# Patient Record
Sex: Female | Born: 1970 | Hispanic: No | Smoking: Never smoker
Health system: Southern US, Community
[De-identification: ages and names within clinical notes are randomized; demographics above are authoritative.]

## PROBLEM LIST (undated history)

## (undated) HISTORY — PX: KNEE SURGERY: SHX244

---

## 2011-09-04 ENCOUNTER — Ambulatory Visit: Payer: Self-pay | Admitting: Internal Medicine

## 2015-06-07 ENCOUNTER — Other Ambulatory Visit: Payer: Self-pay | Admitting: Internal Medicine

## 2015-06-07 ENCOUNTER — Ambulatory Visit (INDEPENDENT_AMBULATORY_CARE_PROVIDER_SITE_OTHER): Payer: BLUE CROSS/BLUE SHIELD | Admitting: Internal Medicine

## 2015-06-07 ENCOUNTER — Encounter: Payer: Self-pay | Admitting: Internal Medicine

## 2015-06-07 VITALS — BP 108/70 | HR 71 | Temp 98.5°F | Resp 16 | Ht 62.5 in | Wt 121.3 lb

## 2015-06-07 DIAGNOSIS — J3089 Other allergic rhinitis: Secondary | ICD-10-CM | POA: Diagnosis not present

## 2015-06-07 DIAGNOSIS — L501 Idiopathic urticaria: Secondary | ICD-10-CM

## 2015-06-07 MED ORDER — FEXOFENADINE HCL 180 MG PO TABS: 180.0000 mg | ORAL_TABLET | Freq: Every evening | ORAL | Status: AC

## 2015-06-07 MED ORDER — LORATADINE 10 MG PO TABS: 10.0000 mg | ORAL_TABLET | Freq: Every day | ORAL | Status: AC

## 2015-06-07 MED ORDER — FLUTICASONE PROPIONATE 50 MCG/ACT NA SUSP
1.0000 | Freq: Two times a day (BID) | NASAL | Status: AC
Start: 2015-06-07 — End: ?

## 2015-06-07 MED ORDER — AMOXICILLIN-POT CLAVULANATE 875-125 MG PO TABS
ORAL_TABLET | ORAL | Status: DC
Start: 2015-06-07 — End: 2015-08-02

## 2015-06-07 MED ORDER — FLUTICASONE PROPIONATE 50 MCG/ACT NA SUSP: 1.0000 | Freq: Two times a day (BID) | NASAL | Status: DC

## 2015-06-07 MED ORDER — AMOXICILLIN-POT CLAVULANATE 875-125 MG PO TABS: ORAL_TABLET | ORAL | Status: DC

## 2015-06-07 NOTE — Progress Notes (Signed)
Referring provider: Jonathon Resides, MD [place] [address] [place] [address]  History of Present Illness:  Lisa Rodriguez is a 45 y.o. female seen in consultation at the kind request of Dr. Dion Saucier for urticaria.  HPI Comments: Urticaria: For the past month, she has had intermittent hives her neck while she was at work one day.  It has continued on a daily or every other day basis. She occasionally has scattered symptoms over her chest and back but is most concentrated over her neck. She brings in a picture today that is consistent with urticaria. She was seen at the urgent care center and started on Zyrtec, ranitidine and given a steroid injection with improvement in her symptoms.  Despite being on antihistamines, she continued to have some breakthrough urticaria.he denies any triggers such as foods, medications, products.  Rhinitis: Patient has had symptoms since childhood. She feels that she is "always sick". Spring seems to be her worst season. She denies repeated infections requiring antibiotics or unusual infections.   Assessment and Plan: Idiopathic urticaria  Keep a food, medication, activity log for any future reactions  Consider product allergy testing  Use Claritin 10 mg daily in the morning and add Allegra 180 mg daily in the evening  May use Benadryl on an as-needed basis for breakthrough symptoms  Check CU index panel, ANA tryptase, H. Pylori breath test   Other allergic rhinitis  Allergic to mold, dust mite, cockroach. Handouts given  Antihistamines as above  Start Flonase one spray each nostril twice a day  Use nasal saline lavage twice a day prior to nasal sprays  Perform immune screen.  Check CBC, cMP, ESR, quantitative antibody levels, tetanus/pneumococcal/diphtheria titers, CH 50  Start Augmentin 875 mg twice a day for 10 days    Return in about 6 weeks (around 07/19/2015).  Medications ordered this encounter: Meds ordered this encounter   Medications  . Vitamin D, Ergocalciferol, (DRISDOL) 50000 units CAPS capsule    Sig: Take 50,000 Units by mouth every 7 (seven) days.  . Cyanocobalamin (B-12) 500 MCG TABS    Sig: Take 500 mg by mouth every other day.  Marland Kitchen DISCONTD: cetirizine (ZYRTEC) 10 MG tablet    Sig: Take 10 mg by mouth daily.  Marland Kitchen DISCONTD: ranitidine (ZANTAC) 150 MG tablet    Sig: Take 150 mg by mouth 2 (two) times daily.  Marland Kitchen triamcinolone cream (KENALOG) 0.1 %    Sig: Apply 1 application topically 2 (two) times daily.  Marland Kitchen DISCONTD: fluticasone (FLONASE) 50 MCG/ACT nasal spray    Sig: Place 1 spray into both nostrils 2 (two) times daily.    Dispense:  1 g    Refill:  5  . DISCONTD: amoxicillin-clavulanate (AUGMENTIN) 875-125 MG tablet    Sig: TAKE ON TABLET TWICE A DAY FOR 10 DAYS    Dispense:  20 tablet    Refill:  0  . loratadine (CLARITIN) 10 MG tablet    Sig: Take 1 tablet (10 mg total) by mouth daily.    Dispense:  30 tablet    Refill:  5  . fexofenadine (ALLEGRA) 180 MG tablet    Sig: Take 1 tablet (180 mg total) by mouth every evening.    Dispense:  30 tablet    Refill:  5  . amoxicillin-clavulanate (AUGMENTIN) 875-125 MG tablet    Sig: One tablet by mouth twice daily for 10 days for infection    Dispense:  20 tablet    Refill:  0  .  fluticasone (FLONASE) 50 MCG/ACT nasal spray    Sig: Place 1 spray into both nostrils 2 (two) times daily.    Dispense:  16 g    Refill:  5    For stuffy nose or drainage    Diagnostics: Aeroallergen skin testing: Positive for mold, dust mite, cockroach with a good histamine control Food allergy skin testing: Negative with a good histamine control  Skin tests were interpreted by me, transferred into EPIC by CMA, reviewed and accepted by me into EPIC.  Physical Exam: BP 108/70 mmHg  Pulse 71  Temp(Src) 98.5 F (36.9 C) (Oral)  Resp 16  Ht 5' 2.5" (1.588 m)  Wt 121 lb 4.1 oz (55 kg)  BMI 21.81 kg/m2   Physical Exam  Constitutional: She appears  well-developed and well-nourished. No distress.  HENT:  Right Ear: External ear normal.  Left Ear: External ear normal.  Nose: Nose normal.  Mouth/Throat: Oropharynx is clear and moist.  Eyes: Conjunctivae are normal. Right eye exhibits no discharge. Left eye exhibits no discharge.  Cardiovascular: Normal rate, regular rhythm and normal heart sounds.   No murmur heard. Pulmonary/Chest: Effort normal and breath sounds normal. No respiratory distress. She has no wheezes. She has no rales.  Abdominal: Soft. Bowel sounds are normal.  Musculoskeletal: She exhibits no edema.  Lymphadenopathy:    She has no cervical adenopathy.  Neurological: She is alert.  Skin: Rash (1 urticarial lesion on her neck) noted.  Vitals reviewed.   Review of systems: Per HPI unless specifically indicated below Review of Systems  Constitutional: Negative for fever, chills, appetite change and unexpected weight change.  HENT: Negative for congestion, ear pain, postnasal drip, rhinorrhea, sinus pressure, sneezing and sore throat.   Eyes: Positive for discharge (watery) and itching. Negative for pain.  Respiratory: Negative for cough, chest tightness and wheezing.   Cardiovascular: Negative for chest pain and leg swelling.  Gastrointestinal: Negative for vomiting and diarrhea.  Genitourinary: Negative for difficulty urinating.  Musculoskeletal: Negative for joint swelling and arthralgias.  Skin: Positive for rash (per hpi).  Allergic/Immunologic: Negative for environmental allergies, food allergies and immunocompromised state.       Stung by unidentified insect, local swelling No latex allergy  Neurological: Negative for seizures.    Past medical history:  Patient Active Problem List   Diagnosis Date Noted  . Idiopathic urticaria 06/07/2015  . Other allergic rhinitis 06/07/2015    Past surgical history: History reviewed. No pertinent past surgical history.  Family history: Family History  Problem  Relation Age of Onset  . Asthma Brother     Environmental/Social history: She lives in a house that is 47 years of age, she has a non-feather pillow and a feather comforter, there is wood and carpet in the home, there is central air conditioning and heating, there are no pets in the home, she is a nonsmoker, she works as a Education administrator.  Drug Allergies:  Allergies  Allergen Reactions  . Hydrocodone     Medications: Current outpatient prescriptions:  .  amoxicillin-clavulanate (AUGMENTIN) 875-125 MG tablet, One tablet by mouth twice daily for 10 days for infection, Disp: 20 tablet, Rfl: 0 .  Cyanocobalamin (B-12) 500 MCG TABS, Take 500 mg by mouth every other day., Disp: , Rfl:  .  fexofenadine (ALLEGRA) 180 MG tablet, Take 1 tablet (180 mg total) by mouth every evening., Disp: 30 tablet, Rfl: 5 .  fluticasone (FLONASE) 50 MCG/ACT nasal spray, Place 1 spray into both nostrils 2 (two) times  daily., Disp: 16 g, Rfl: 5 .  loratadine (CLARITIN) 10 MG tablet, Take 1 tablet (10 mg total) by mouth daily., Disp: 30 tablet, Rfl: 5 .  triamcinolone cream (KENALOG) 0.1 %, Apply 1 application topically 2 (two) times daily., Disp: , Rfl:  .  Vitamin D, Ergocalciferol, (DRISDOL) 50000 units CAPS capsule, Take 50,000 Units by mouth every 7 (seven) days., Disp: , Rfl:   Thank you for the opportunity to care for this patient.  Please do not hesitate to contact me with questions.

## 2015-06-07 NOTE — Patient Instructions (Addendum)
Idiopathic urticaria  Keep a food, medication, activity log for any future reactions  Consider product allergy testing  Use Claritin 10 mg daily in the morning and add Allegra 180 mg daily in the evening  May use Benadryl on an as-needed basis for breakthrough symptoms  Check CU index panel, ANA tryptase, H. Pylori breath test   Other allergic rhinitis  Allergic to mold, cockroach. Handouts given  Antihistamines as above  Start Flonase one spray each nostril twice a day  Use nasal saline lavage twice a day prior to nasal sprays  Perform immune screen.  Check CBC, cMP, ESR, quantitative antibody levels, tetanus/pneumococcal/diphtheria titers, CH 50  Start Augmentin 875 mg twice a day for 10 days

## 2015-06-07 NOTE — Assessment & Plan Note (Addendum)
   Allergic to mold, dust mite, cockroach. Handouts given  Antihistamines as above  Start Flonase one spray each nostril twice a day  Use nasal saline lavage twice a day prior to nasal sprays  Perform immune screen.  Check CBC, cMP, ESR, quantitative antibody levels, tetanus/pneumococcal/diphtheria titers, CH 50  Start Augmentin 875 mg twice a day for 10 days

## 2015-06-07 NOTE — Assessment & Plan Note (Addendum)
   Keep a food, medication, activity log for any future reactions  Consider product allergy testing  Use Claritin 10 mg daily in the morning and add Allegra 180 mg daily in the evening  May use Benadryl on an as-needed basis for breakthrough symptoms  Check CU index panel, ANA tryptase, H. Pylori breath test

## 2015-06-10 LAB — TISSUE TRANSGLUTAMINASE, IGA: Tissue Transglutaminase Ab, IgA: 1 U/mL (ref <4)

## 2015-06-10 LAB — DIPHTHERIA / TETANUS ANTIBODY PANEL: Tetanus Toxin Antibody, Total: 4.43 IU/mL (ref >0.15)

## 2015-06-10 LAB — COMPLEMENT, TOTAL: Compl, Total (CH50): >60 U/mL — ABNORMAL HIGH (ref 31–60)

## 2015-06-10 LAB — IGG, IGA, IGM
IGA: 240 mg/dL (ref 69–380)
IgG (Immunoglobin G), Serum: 1430 mg/dL (ref 690–1700)
IgM, Serum: 105 mg/dL (ref 52–322)

## 2015-06-11 LAB — STREP PNEUMONIAE 23 SEROTYPES IGG
SEROTYPE 1 (1): 0.7 ug/mL
SEROTYPE 14 (14): 2.9 ug/mL
SEROTYPE 19 (19F): 1.5 ug/mL
SEROTYPE 20 (20): 3.6 ug/mL
SEROTYPE 54 (15B): 6 ug/mL
SEROTYPE 68 (9V): 1.2 ug/mL
Serotype 17 (17F): 1.9 ug/mL
Serotype 22 (22F): <0.3 ug/mL
Serotype 23 (23F): <0.3 ug/mL
Serotype 26 (6B): <0.3 ug/mL
Serotype 3 (3): <0.3 ug/mL
Serotype 34 (10A): <0.3 ug/mL
Serotype 4 (4): <0.3 ug/mL
Serotype 43 (11A): <0.3 ug/mL
Serotype 5 (5): 1.6 ug/mL
Serotype 51 (7F): 0.6 ug/mL
Serotype 56 (18C): 0.8 ug/mL
Serotype 70 (33F): 1.9 ug/mL
Serotype 8 (8): <0.3 ug/mL
Serotype 9 (9N): <0.3 ug/mL

## 2015-06-13 ENCOUNTER — Encounter: Payer: Self-pay | Admitting: *Deleted

## 2015-06-18 LAB — CP CHRONIC URTICARIA INDEX PANEL
THYROID PEROXIDASE ANTIBODY: 5 [IU]/mL (ref <9)
TSH: 2.84 mIU/L

## 2015-06-24 ENCOUNTER — Other Ambulatory Visit: Payer: Self-pay | Admitting: Internal Medicine

## 2015-06-24 DIAGNOSIS — L501 Idiopathic urticaria: Secondary | ICD-10-CM

## 2015-06-25 LAB — ANTI-NUCLEAR AB-TITER (ANA TITER): ANA Titer 1: >=1:1280 {titer} — ABNORMAL HIGH

## 2015-06-25 LAB — CBC WITH DIFFERENTIAL/PLATELET
BASOS ABS: 0 10*3/uL (ref 0.0–0.1)
BASOS PCT: 0 % (ref 0–1)
EOS ABS: 0.1 10*3/uL (ref 0.0–0.7)
EOS PCT: 2 % (ref 0–5)
HCT: 38 % (ref 36.0–46.0)
Hemoglobin: 12 g/dL (ref 12.0–15.0)
Lymphocytes Relative: 31 % (ref 12–46)
Lymphs Abs: 1.6 10*3/uL (ref 0.7–4.0)
MCH: 26.9 pg (ref 26.0–34.0)
MCHC: 31.6 g/dL (ref 30.0–36.0)
MCV: 85.2 fL (ref 78.0–100.0)
MPV: 11.7 fL (ref 8.6–12.4)
Monocytes Absolute: 0.5 10*3/uL (ref 0.1–1.0)
Monocytes Relative: 10 % (ref 3–12)
NEUTROS PCT: 57 % (ref 43–77)
Neutro Abs: 2.9 10*3/uL (ref 1.7–7.7)
Platelets: 311 10*3/uL (ref 150–400)
RBC: 4.46 MIL/uL (ref 3.87–5.11)
RDW: 16 % — AB (ref 11.5–15.5)
WBC: 5.1 10*3/uL (ref 4.0–10.5)

## 2015-06-25 LAB — ANA: ANA: POSITIVE — AB

## 2015-06-25 LAB — COMPLETE METABOLIC PANEL WITH GFR
ALK PHOS: 24 U/L — AB (ref 33–115)
ALT: 18 U/L (ref 6–29)
AST: 20 U/L (ref 10–35)
Albumin: 4 g/dL (ref 3.6–5.1)
BILIRUBIN TOTAL: 0.7 mg/dL (ref 0.2–1.2)
BUN: 9 mg/dL (ref 7–25)
CALCIUM: 9.3 mg/dL (ref 8.6–10.2)
CO2: 26 mmol/L (ref 20–31)
CREATININE: 0.69 mg/dL (ref 0.50–1.10)
Chloride: 105 mmol/L (ref 98–110)
Glucose, Bld: 83 mg/dL (ref 65–99)
Potassium: 4.2 mmol/L (ref 3.5–5.3)
Sodium: 138 mmol/L (ref 135–146)
TOTAL PROTEIN: 7.1 g/dL (ref 6.1–8.1)

## 2015-06-25 LAB — H. PYLORI BREATH TEST

## 2015-06-25 LAB — TRYPTASE: Tryptase: 3.7 ug/L (ref <11)

## 2015-06-25 LAB — SEDIMENTATION RATE: Sed Rate: 6 mm/hr (ref 0–20)

## 2015-07-01 NOTE — Addendum Note (Signed)
Addended by: Clifton JamesLARK, Mackie Goon L on: 07/01/2015 12:19 PM   Modules accepted: Orders

## 2015-07-04 ENCOUNTER — Telehealth: Payer: Self-pay | Admitting: Internal Medicine

## 2015-07-04 NOTE — Telephone Encounter (Signed)
She received a bill for $1470. She says that she spoke with her insurance and they told her that the charges wouldn't be covered. She wants to know if she can get a discount since they won't pay. If she can get a discount she would like to set up payments for the remainder.

## 2015-07-04 NOTE — Telephone Encounter (Signed)
PT WANTS ME TO ASK DR BHATTI FOR A DISCOUNT - TOLD HER I WOULD CALL HER BACK NEXT WEEK

## 2015-07-08 ENCOUNTER — Telehealth: Payer: Self-pay

## 2015-07-08 ENCOUNTER — Encounter (HOSPITAL_BASED_OUTPATIENT_CLINIC_OR_DEPARTMENT_OTHER): Payer: Self-pay | Admitting: *Deleted

## 2015-07-08 ENCOUNTER — Emergency Department (HOSPITAL_BASED_OUTPATIENT_CLINIC_OR_DEPARTMENT_OTHER)
Admission: EM | Admit: 2015-07-08 | Discharge: 2015-07-08 | Disposition: A | Discharge status: Home / Self Care | Payer: BLUE CROSS/BLUE SHIELD | Attending: Emergency Medicine | Admitting: Emergency Medicine

## 2015-07-08 DIAGNOSIS — R21 Rash and other nonspecific skin eruption: Secondary | ICD-10-CM | POA: Diagnosis present

## 2015-07-08 DIAGNOSIS — L299 Pruritus, unspecified: Secondary | ICD-10-CM | POA: Insufficient documentation

## 2015-07-08 DIAGNOSIS — Z79899 Other long term (current) drug therapy: Secondary | ICD-10-CM | POA: Diagnosis not present

## 2015-07-08 DIAGNOSIS — Z7951 Long term (current) use of inhaled steroids: Secondary | ICD-10-CM | POA: Insufficient documentation

## 2015-07-08 DIAGNOSIS — L539 Erythematous condition, unspecified: Secondary | ICD-10-CM | POA: Insufficient documentation

## 2015-07-08 DIAGNOSIS — Z7952 Long term (current) use of systemic steroids: Secondary | ICD-10-CM | POA: Diagnosis not present

## 2015-07-08 MED ORDER — PREDNISONE 20 MG PO TABS: 60.0000 mg | ORAL_TABLET | Freq: Every day | ORAL | Status: DC

## 2015-07-08 NOTE — ED Provider Notes (Signed)
CSN: [id number]     Arrival date & time 07/08/15  1036 History   First MD Initiated Contact with Patient 07/08/15 1237     Chief Complaint  Patient presents with  . Rash     (Consider location/radiation/quality/duration/timing/severity/associated sxs/prior Treatment) HPI Comments: Patient presents today with a rash located on her back and also the left anterior chest.  She states that the rash of her chest has been present for the past 1.5 months.  She reports that she was seen by her PCP for this rash and was then referred to an Allergist.  She states that the Allergist ordered several tests and could not find an actual allergy.  She also reports that a ANA was ordered and was found to be elevated.  She was then referred to a Rheumatologist, but has not yet followed up.  She reports that the rash on her back has been present for the past 4 days.  Rash is pruritic.  She denies new soaps, detergents, lotions, or medications.  She does report that she had a Pneumonia vaccine 5 days ago and thinks that she may be having a reaction to that.  She denies any swelling of the lips, tongue, or throat.  Denies SOB or wheezing.  Denies numbness or tingling.  She states that she is taking Zyrtec, but does not feel that it is helping the rash.    The history is provided by the patient.    History reviewed. No pertinent past medical history. Past Surgical History  Procedure Laterality Date  . Knee surgery     Family History  Problem Relation Age of Onset  . Asthma Brother    Social History  Substance Use Topics  . Smoking status: Never Smoker   . Smokeless tobacco: None  . Alcohol Use: No   OB History    No data available     Review of Systems  All other systems reviewed and are negative.     Allergies  Hydrocodone  Home Medications   Prior to Admission medications   Medication Sig Start Date End Date Taking? Authorizing Provider  cetirizine (ZYRTEC) 10 MG tablet Take 10 mg by mouth  daily.   Yes Historical Provider, MD  Cyanocobalamin (B-12) 500 MCG TABS Take 500 mg by mouth every other day.   Yes Historical Provider, MD  triamcinolone cream (KENALOG) 0.1 % Apply 1 application topically 2 (two) times daily.   Yes Historical Provider, MD  Vitamin D, Ergocalciferol, (DRISDOL) 50000 units CAPS capsule Take 50,000 Units by mouth every 7 (seven) days.   Yes Historical Provider, MD  amoxicillin-clavulanate (AUGMENTIN) 875-125 MG tablet One tablet by mouth twice daily for 10 days for infection 06/07/15   Mikki SanteeSokun Bhatti, MD  fexofenadine (ALLEGRA) 180 MG tablet Take 1 tablet (180 mg total) by mouth every evening. 06/07/15   Mikki SanteeSokun Bhatti, MD  fluticasone (FLONASE) 50 MCG/ACT nasal spray Place 1 spray into both nostrils 2 (two) times daily. 06/07/15   Mikki SanteeSokun Bhatti, MD  loratadine (CLARITIN) 10 MG tablet Take 1 tablet (10 mg total) by mouth daily. 06/07/15   Mikki SanteeSokun Bhatti, MD   BP 138/94 mmHg  Pulse 77  Temp(Src) 97.8 F (36.6 C) (Oral)  Resp 18  Ht 5\' 4"  (1.626 m)  Wt 54.885 kg  BMI 20.76 kg/m2  SpO2 100%  LMP 06/24/2015 Physical Exam  Constitutional: She appears well-developed and well-nourished.  HENT:  Head: Normocephalic and atraumatic.  Mouth/Throat: Oropharynx is clear and moist.  Neck: Normal range  of motion. Neck supple.  Cardiovascular: Normal rate, regular rhythm and normal heart sounds.   Pulmonary/Chest: Effort normal and breath sounds normal. No respiratory distress. She has no wheezes. She has no rales.  Musculoskeletal: Normal range of motion.  Neurological: She is alert.  Skin: Skin is warm and dry.     Erythematous papular rash located left anterior chest and down the center of the back.  No drainage.  Psychiatric: She has a normal mood and affect.  Nursing note and vitals reviewed.   ED Course  Procedures (including critical care time) Labs Review Labs Reviewed - No data to display  Imaging Review No results found. I have personally reviewed and  evaluated these images and lab results as part of my medical decision-making.   EKG Interpretation None      MDM   Final diagnoses:  None   Patient presents today with a rash of her chest that has been there 1.5 months and also a rash of her back that has been present for 4 days.  She reports that she recently saw an Allergist and had an ANA blood test, which came back elevated.  She was referred to Rheumatology, but has not yet followed up.  No signs of Anaphylaxis.  Feel that the patient is stable for discharge.  Return precautions given.   Santiago Glad, PA-C 07/09/15 4098  Geoffery Lyons, MD 07/11/15 [phone]

## 2015-07-08 NOTE — ED Notes (Signed)
Rash on trunk since end of February. Denies fever. Using zyrtec

## 2015-07-08 NOTE — Telephone Encounter (Signed)
Patient called this morning anxious about getting referral to [facility].  Vella RedheadHeather Clark states she  made referral last week.  States we are waiting for [facility] [phone] to call us and patient with appointment status.  I called them to get an update on status of referral.  Spoke with Inetta Fermoina, then Lawson FiscalLori at [facility].  I was told referral was never received.  Lawson FiscalLori suggested I fax all demographics, notes and labs/imaging results to [phone].  Faxed all info to LadueLori this morning. Patient stating she now has a bad rash on back around spine and on her chest area.  She went to and emergency clinic over the weekend and was told this could be an allergic reaction.  Zyrtec and Allegra not helping.  Patient wants to speak with Dr. Clydie BraunBhatti about rash and could this be coming from Pneumovax pt received recently.

## 2015-07-08 NOTE — Telephone Encounter (Signed)
[facility] faxed referral authorization at 3:25pm today.  Pt has appointment with them on 07/16/15 at 9:00am.  Patient is aware of appointment.

## 2015-07-12 NOTE — Telephone Encounter (Signed)
noted 

## 2015-07-17 NOTE — Telephone Encounter (Signed)
Dr Clydie BraunBhatti gave her a 10% discount - called pt  - she will pay $50/mo

## 2015-07-19 ENCOUNTER — Ambulatory Visit: Payer: BLUE CROSS/BLUE SHIELD | Admitting: Internal Medicine

## 2015-08-02 ENCOUNTER — Ambulatory Visit: Payer: BLUE CROSS/BLUE SHIELD | Admitting: Internal Medicine

## 2015-08-02 ENCOUNTER — Ambulatory Visit (INDEPENDENT_AMBULATORY_CARE_PROVIDER_SITE_OTHER): Payer: BLUE CROSS/BLUE SHIELD | Admitting: Internal Medicine

## 2015-08-02 ENCOUNTER — Encounter: Payer: Self-pay | Admitting: Internal Medicine

## 2015-08-02 VITALS — BP 110/70 | HR 62 | Temp 98.3°F | Resp 14

## 2015-08-02 DIAGNOSIS — J3089 Other allergic rhinitis: Secondary | ICD-10-CM | POA: Diagnosis not present

## 2015-08-02 DIAGNOSIS — L501 Idiopathic urticaria: Secondary | ICD-10-CM

## 2015-08-02 NOTE — Assessment & Plan Note (Signed)
   Currently well controlled  Continue Zyrtec 10 mg daily as needed  May add Claritin or Allegra for breakthrough symptoms

## 2015-08-02 NOTE — Assessment & Plan Note (Signed)
   Check postvaccine titers in 6 weeks to pneumococcus  Consider allergy injections. She will let us know if she wishes to proceed  Continue Zyrtec 10 mg daily along with as needed fluticasone

## 2015-08-02 NOTE — Patient Instructions (Signed)
Other allergic rhinitis  Check postvaccine titers in 6 weeks to pneumococcus  Consider allergy injections. She will let us know if she wishes to proceed  Continue Zyrtec 10 mg daily along with as needed fluticasone  Idiopathic urticaria  Currently well controlled  Continue Zyrtec 10 mg daily as needed  May add Claritin or Allegra for breakthrough symptoms

## 2015-08-02 NOTE — Progress Notes (Signed)
History of Present Illness: Lisa Rodriguez is a 45 y.o. female presenting for follow-up  HPI Comments: Urticaria: Symptoms have been well controlled with Zyrtec. She has not had any rash for a few days. Lab work ordered at last visit revealed a normal CBC with differential, CMP, ESR, tryptase, H. pylori, CU index panel  Allergic rhinitis: Skin testing at last visit was positive for mold, dust mite, cockroach. She is having good symptom control with Zyrtec and as needed fluticasone. She has not had any interval infections. An immune screen done because of her frequent infections demonstrated normal quantitative antibody levels, CH 50, tetanus and diphtheria titers. Pneumococcal titers were not protective so she received a Pneumovax and is due to get postvaccine titers in 6 weeks.   Current Outpatient Prescriptions on File Prior to Visit  Medication Sig Dispense Refill  . cetirizine (ZYRTEC) 10 MG tablet Take 10 mg by mouth daily.    . Cyanocobalamin (B-12) 500 MCG TABS Take 500 mg by mouth every other day.    . fluticasone (FLONASE) 50 MCG/ACT nasal spray Place 1 spray into both nostrils 2 (two) times daily. 16 g 5  . triamcinolone cream (KENALOG) 0.1 % Apply 1 application topically as needed.     . Vitamin D, Ergocalciferol, (DRISDOL) 50000 units CAPS capsule Take 50,000 Units by mouth every 7 (seven) days.    . fexofenadine (ALLEGRA) 180 MG tablet Take 1 tablet (180 mg total) by mouth every evening. (Patient not taking: Reported on 08/02/2015) 30 tablet 5  . loratadine (CLARITIN) 10 MG tablet Take 1 tablet (10 mg total) by mouth daily. (Patient not taking: Reported on 08/02/2015) 30 tablet 5   No current facility-administered medications on file prior to visit.    Assessment and Plan: Other allergic rhinitis  Check postvaccine titers in 6 weeks to pneumococcus  Consider allergy injections. She will let us know if she wishes to proceed  Continue Zyrtec 10 mg daily along with as needed  fluticasone  Idiopathic urticaria  Currently well controlled  Continue Zyrtec 10 mg daily as needed  May add Claritin or Allegra for breakthrough symptoms    Return in about 1 year (around 08/01/2016).  No orders of the defined types were placed in this encounter.   Physical Exam: BP 110/70 mmHg  Pulse 62  Temp(Src) 98.3 F (36.8 C) (Oral)  Resp 14  LMP 06/24/2015   Physical Exam  Constitutional: She appears well-developed and well-nourished. No distress.  HENT:  Right Ear: External ear normal.  Left Ear: External ear normal.  Nose: Nose normal.  Mouth/Throat: Oropharynx is clear and moist.  Eyes: Conjunctivae are normal. Right eye exhibits no discharge. Left eye exhibits no discharge.  Cardiovascular: Normal rate, regular rhythm and normal heart sounds.   No murmur heard. Pulmonary/Chest: Effort normal and breath sounds normal. No respiratory distress. She has no wheezes. She has no rales.  Abdominal: Soft. Bowel sounds are normal.  Musculoskeletal: She exhibits no edema.  Lymphadenopathy:    She has no cervical adenopathy.  Neurological: She is alert.  Skin: No rash noted.  Vitals reviewed.   Drug Allergies:  Allergies  Allergen Reactions  . Hydrocodone     ROS: Per HPI unless specifically indicated below Review of Systems  Thank you for the opportunity to care for this patient.  Please do not hesitate to contact me with questions.

## 2015-08-15 ENCOUNTER — Telehealth: Payer: Self-pay

## 2015-08-15 NOTE — Telephone Encounter (Signed)
PATIENT CALLED. WANTS TO SPEAK WITH DR BHATTI'S NURSE.  WOULD NOT STATE WHY WITH LINDA COLLINS. LINDA CALLED PATIENT BACK, NO ANSWER. LEFT MESSAGE.

## 2015-08-19 NOTE — Telephone Encounter (Signed)
LEFT MESSAGE FOR PT TO CALL BACK.

## 2015-08-22 NOTE — Telephone Encounter (Signed)
TRIED TO CALL PATIENT X 3.  LEFT MESSAGE. OK PER DR BHATTI TO CLOSE ENCOUNTER OUT.

## 2016-10-13 DIAGNOSIS — E559 Vitamin D deficiency, unspecified: Secondary | ICD-10-CM | POA: Insufficient documentation

## 2016-10-13 NOTE — Progress Notes (Deleted)
[facility] [place] [address] [place], [place] [address] [phone] [phone]  Date:  10/15/2016   Name:  Lenna GilfordBinta Speelman   DOB:  [birth year]   MRN:  [id number]  PCP:  Gillian ScarceZanard, Robyn K, MD    Chief Complaint: No chief complaint on file.   History of Present Illness:  Lenna GilfordBinta Kelnhofer is a 46 y.o. very pleasant female patient who presents with the following:  Here today as a new patient to this office.  [facility]. A/P from that visit mentions history of vitamin D def, positive ANA with negative follow-up testing and borderline low hg.   Here today with concern of      Patient Active Problem List   Diagnosis Date Noted  . Idiopathic urticaria 06/07/2015  . Other allergic rhinitis 06/07/2015    No past medical history on file.  Past Surgical History:  Procedure Laterality Date  . KNEE SURGERY      Social History  Substance Use Topics  . Smoking status: Never Smoker  . Smokeless tobacco: Not on file  . Alcohol use No    Family History  Problem Relation Age of Onset  . Asthma Brother     Allergies  Allergen Reactions  . Hydrocodone     Medication list has been reviewed and updated.  Current Outpatient Prescriptions on File Prior to Visit  Medication Sig Dispense Refill  . cetirizine (ZYRTEC) 10 MG tablet Take 10 mg by mouth daily.    . Cyanocobalamin (B-12) 500 MCG TABS Take 500 mg by mouth every other day.    . fexofenadine (ALLEGRA) 180 MG tablet Take 1 tablet (180 mg total) by mouth every evening. (Patient not taking: Reported on 08/02/2015) 30 tablet 5  . fluticasone (FLONASE) 50 MCG/ACT nasal spray Place 1 spray into both nostrils 2 (two) times daily. 16 g 5  . loratadine (CLARITIN) 10 MG tablet Take 1 tablet (10 mg total) by mouth daily. (Patient not taking: Reported on 08/02/2015) 30 tablet 5  . triamcinolone cream (KENALOG) 0.1 % Apply 1 application topically as needed.     .  Vitamin D, Ergocalciferol, (DRISDOL) 50000 units CAPS capsule Take 50,000 Units by mouth every 7 (seven) days.     No current facility-administered medications on file prior to visit.     Review of Systems:  As per HPI- otherwise negative.   Physical Examination: There were no vitals filed for this visit. There were no vitals filed for this visit. There is no height or weight on file to calculate BMI. Ideal Body Weight:    GEN: WDWN, NAD, Non-toxic, A & O x 3 HEENT: Atraumatic, Normocephalic. Neck supple. No masses, No LAD. Ears and Nose: No external deformity. CV: RRR, No M/G/R. No JVD. No thrill. No extra heart sounds. PULM: CTA B, no wheezes, crackles, rhonchi. No retractions. No resp. distress. No accessory muscle use. ABD: S, NT, ND, +BS. No rebound. No HSM. EXTR: No c/c/e NEURO Normal gait.  PSYCH: Normally interactive. Conversant. Not depressed or anxious appearing.  Calm demeanor.    Assessment and Plan: ***  Signed Abbe AmsterdamJessica Kanita Delage, MD

## 2016-10-15 ENCOUNTER — Encounter: Payer: Self-pay | Admitting: Family Medicine

## 2016-10-15 ENCOUNTER — Ambulatory Visit: Payer: BLUE CROSS/BLUE SHIELD | Admitting: Family Medicine

## 2017-02-11 ENCOUNTER — Telehealth: Payer: Self-pay | Admitting: Family Medicine

## 2017-02-11 NOTE — Telephone Encounter (Signed)
Pt says that she called to cancel her np apt that was scheduled on 10/15/16. Im not showing were pt cancelled. Pt would like to know if provider could waive fee.   Please advise.

## 2017-02-11 NOTE — Telephone Encounter (Signed)
Great!.  [place] could you waive no show fee.    Thanks

## 2017-02-11 NOTE — Telephone Encounter (Signed)
That is fine, can waive fee

## 2018-05-03 ENCOUNTER — Emergency Department (HOSPITAL_COMMUNITY): Payer: Commercial Managed Care - PPO

## 2018-05-03 ENCOUNTER — Emergency Department (HOSPITAL_COMMUNITY)
Admission: EM | Admit: 2018-05-03 | Discharge: 2018-05-03 | Disposition: A | Discharge status: Home / Self Care | Payer: Commercial Managed Care - PPO | Attending: Emergency Medicine | Admitting: Emergency Medicine

## 2018-05-03 ENCOUNTER — Encounter (HOSPITAL_COMMUNITY): Payer: Self-pay | Admitting: *Deleted

## 2018-05-03 ENCOUNTER — Other Ambulatory Visit: Payer: Self-pay

## 2018-05-03 DIAGNOSIS — R1013 Epigastric pain: Secondary | ICD-10-CM | POA: Diagnosis not present

## 2018-05-03 DIAGNOSIS — R0789 Other chest pain: Secondary | ICD-10-CM | POA: Diagnosis not present

## 2018-05-03 DIAGNOSIS — Z79899 Other long term (current) drug therapy: Secondary | ICD-10-CM | POA: Insufficient documentation

## 2018-05-03 DIAGNOSIS — R0602 Shortness of breath: Secondary | ICD-10-CM | POA: Diagnosis not present

## 2018-05-03 DIAGNOSIS — R112 Nausea with vomiting, unspecified: Secondary | ICD-10-CM | POA: Diagnosis not present

## 2018-05-03 LAB — I-STAT BETA HCG BLOOD, ED (MC, WL, AP ONLY): I-stat hCG, quantitative: <5 m[IU]/mL (ref <5)

## 2018-05-03 LAB — CBC WITH DIFFERENTIAL/PLATELET
Abs Immature Granulocytes: 0.03 10*3/uL (ref 0.00–0.07)
BASOS PCT: 1 %
Basophils Absolute: 0.1 10*3/uL (ref 0.0–0.1)
Eosinophils Absolute: 0.4 10*3/uL (ref 0.0–0.5)
Eosinophils Relative: 4 %
HCT: 36.8 % (ref 36.0–46.0)
Hemoglobin: 11.5 g/dL — ABNORMAL LOW (ref 12.0–15.0)
Immature Granulocytes: 0 %
Lymphocytes Relative: 25 %
Lymphs Abs: 2.6 10*3/uL (ref 0.7–4.0)
MCH: 26.2 pg (ref 26.0–34.0)
MCHC: 31.3 g/dL (ref 30.0–36.0)
MCV: 83.8 fL (ref 80.0–100.0)
MONO ABS: 0.8 10*3/uL (ref 0.1–1.0)
Monocytes Relative: 7 %
Neutro Abs: 6.6 10*3/uL (ref 1.7–7.7)
Neutrophils Relative %: 63 %
Platelets: 263 10*3/uL (ref 150–400)
RBC: 4.39 MIL/uL (ref 3.87–5.11)
RDW: 14.3 % (ref 11.5–15.5)
WBC: 10.5 10*3/uL (ref 4.0–10.5)
nRBC: 0 % (ref 0.0–0.2)

## 2018-05-03 LAB — LIPASE, BLOOD: Lipase: 47 U/L (ref 11–51)

## 2018-05-03 LAB — I-STAT TROPONIN, ED
Troponin i, poc: 0 ng/mL (ref 0.00–0.08)
Troponin i, poc: 0 ng/mL (ref 0.00–0.08)

## 2018-05-03 LAB — COMPREHENSIVE METABOLIC PANEL
ALT: 31 U/L (ref 0–44)
AST: 29 U/L (ref 15–41)
Albumin: 3.8 g/dL (ref 3.5–5.0)
Alkaline Phosphatase: 29 U/L — ABNORMAL LOW (ref 38–126)
Anion gap: 8 (ref 5–15)
BUN: 11 mg/dL (ref 6–20)
CO2: 21 mmol/L — ABNORMAL LOW (ref 22–32)
Calcium: 9.2 mg/dL (ref 8.9–10.3)
Chloride: 109 mmol/L (ref 98–111)
Creatinine, Ser: 0.76 mg/dL (ref 0.44–1.00)
Glucose, Bld: 110 mg/dL — ABNORMAL HIGH (ref 70–99)
Potassium: 3.8 mmol/L (ref 3.5–5.1)
Sodium: 138 mmol/L (ref 135–145)
TOTAL PROTEIN: 7.1 g/dL (ref 6.5–8.1)
Total Bilirubin: 0.7 mg/dL (ref 0.3–1.2)

## 2018-05-03 LAB — URINALYSIS, ROUTINE W REFLEX MICROSCOPIC
Bilirubin Urine: NEGATIVE
Glucose, UA: NEGATIVE mg/dL
Hgb urine dipstick: NEGATIVE
Ketones, ur: NEGATIVE mg/dL
Nitrite: NEGATIVE
PH: 8 (ref 5.0–8.0)
Protein, ur: NEGATIVE mg/dL
Specific Gravity, Urine: 1.004 — ABNORMAL LOW (ref 1.005–1.030)

## 2018-05-03 MED ORDER — ALUM & MAG HYDROXIDE-SIMETH 200-200-20 MG/5ML PO SUSP
30.0000 mL | Freq: Once | ORAL | Status: AC
  Administered 2018-05-03: 30 mL via ORAL
  Filled 2018-05-03: qty 30

## 2018-05-03 MED ORDER — DICYCLOMINE HCL 10 MG/5ML PO SOLN
10.0000 mg | Freq: Once | ORAL | Status: AC
  Administered 2018-05-03: 10 mg via ORAL
  Filled 2018-05-03: qty 5

## 2018-05-03 MED ORDER — IOPAMIDOL (ISOVUE-370) INJECTION 76%
100.0000 mL | Freq: Once | INTRAVENOUS | Status: AC | PRN
  Administered 2018-05-03: 100 mL via INTRAVENOUS

## 2018-05-03 MED ORDER — ONDANSETRON HCL 4 MG/2ML IJ SOLN
4.0000 mg | Freq: Once | INTRAMUSCULAR | Status: AC
  Administered 2018-05-03: 4 mg via INTRAVENOUS
  Filled 2018-05-03: qty 2

## 2018-05-03 MED ORDER — IOPAMIDOL (ISOVUE-370) INJECTION 76%
INTRAVENOUS | Status: AC
  Filled 2018-05-03: qty 100

## 2018-05-03 MED ORDER — FAMOTIDINE 20 MG PO TABS
20.0000 mg | ORAL_TABLET | Freq: Once | ORAL | Status: AC
  Administered 2018-05-03: 20 mg via ORAL
  Filled 2018-05-03: qty 1

## 2018-05-03 MED ORDER — FENTANYL CITRATE (PF) 100 MCG/2ML IJ SOLN
50.0000 ug | Freq: Once | INTRAMUSCULAR | Status: AC
  Administered 2018-05-03: 50 ug via INTRAVENOUS
  Filled 2018-05-03: qty 2

## 2018-05-03 NOTE — ED Notes (Signed)
Patient verbalizes understanding of discharge instructions. Opportunity for questioning and answers were provided. Armband removed by staff, pt discharged from ED ambulatory to home.  

## 2018-05-03 NOTE — ED Notes (Signed)
Delay in EKG crossing over. Pt not in room. Will repeat when pts returns

## 2018-05-03 NOTE — ED Provider Notes (Signed)
[facility]   CSN: [id number] Arrival date & time: 05/03/18  1609     History   Chief Complaint Chief Complaint  Patient presents with  . Abdominal Pain  . Emesis    HPI Lisa Rodriguez is a 48 y.o. female.  48yo F w/ PMH below who p/w abdominal pain. This afternoon at work, she suddenly began having sharp, severe upper abdominal pain that has radiated up into her chest and caused her to feel SOB and nauseated. Pain has been waxing and waning in intensity. She had 4 ASA and NTG at work then EMS gave her another NTG. Pain in abdomen is currently 4/10. She has never had this before. She denies any fevers, diarrhea, urinary symptoms, or recent illness. No recent travel, h/o clots, h/o cancer, estrogen use, or FH of heart disease. No alcohol, tobacco, or drug use.  The history is provided by the patient.  Abdominal Pain  Associated symptoms: vomiting   Emesis  Associated symptoms: abdominal pain     History reviewed. No pertinent past medical history.  Patient Active Problem List   Diagnosis Date Noted  . Vitamin D deficiency 10/13/2016  . Vitamin D deficiency 10/13/2016  . Idiopathic urticaria 06/07/2015  . Other allergic rhinitis 06/07/2015    Past Surgical History:  Procedure Laterality Date  . KNEE SURGERY       OB History   No obstetric history on file.      Home Medications    Prior to Admission medications   Medication Sig Start Date End Date Taking? Authorizing Provider  cetirizine (ZYRTEC) 10 MG tablet Take 10 mg by mouth daily.    [provider]  Cyanocobalamin (B-12) 500 MCG TABS Take 500 mg by mouth every other day.    [provider]  fexofenadine (ALLEGRA) 180 MG tablet Take 1 tablet (180 mg total) by mouth every evening. Patient not taking: Reported on 08/02/2015 06/07/15   Mikki SanteeBhatti, Sokun, MD  fluticasone [facility](FLONASE) 50 MCG/ACT nasal spray Place 1 spray into both nostrils 2 (two) times  daily. 06/07/15   Mikki SanteeBhatti, Sokun, MD  loratadine (CLARITIN) 10 MG tablet Take 1 tablet (10 mg total) by mouth daily. Patient not taking: Reported on 08/02/2015 06/07/15   Mikki SanteeBhatti, Sokun, MD  triamcinolone cream (KENALOG) 0.1 % Apply 1 application topically as needed.     [provider]  Vitamin D, Ergocalciferol, (DRISDOL) 50000 units CAPS capsule Take 50,000 Units by mouth every 7 (seven) days.    [provider]    Family History Family History  Problem Relation Age of Onset  . Asthma Brother     Social History Social History   Tobacco Use  . Smoking status: Never Smoker  . Smokeless tobacco: Never Used  Substance Use Topics  . Alcohol use: No    Alcohol/week: 0.0 standard drinks  . Drug use: No     Allergies   Penicillins and Hydrocodone   Review of Systems Review of Systems  Gastrointestinal: Positive for abdominal pain and vomiting.   All other systems reviewed and are negative except that which was mentioned in HPI   Physical Exam Updated Vital Signs BP (!) 142/89   Pulse 69   Temp 98.5 F (36.9 C) (Oral)   Resp 16   Ht 5\' 6"  (1.676 m)   Wt 57.2 kg   SpO2 99%   BMI 20.34 kg/m   Physical Exam Vitals signs and nursing note reviewed.  Constitutional:  General: She is not in acute distress.    Appearance: She is well-developed.     Comments: uncomfortable  HENT:     Head: Normocephalic and atraumatic.  Eyes:     Conjunctiva/sclera: Conjunctivae normal.  Neck:     Musculoskeletal: Neck supple.  Cardiovascular:     Rate and Rhythm: Normal rate and regular rhythm.     Heart sounds: Normal heart sounds. No murmur.  Pulmonary:     Effort: Pulmonary effort is normal.     Breath sounds: Normal breath sounds.  Abdominal:     General: Bowel sounds are normal. There is no distension.     Palpations: Abdomen is soft.     Tenderness: There is abdominal tenderness in the right upper quadrant and epigastric area. There is no guarding or  rebound.  Skin:    General: Skin is warm and dry.  Neurological:     Mental Status: She is alert and oriented to person, place, and time.     Comments: Fluent speech  Psychiatric:        Judgment: Judgment normal.      ED Treatments / Results  Labs (all labs ordered are listed, but only abnormal results are displayed) Labs Reviewed  COMPREHENSIVE METABOLIC PANEL - Abnormal; Notable for the following components:      Result Value   CO2 21 (*)    Glucose, Bld 110 (*)    Alkaline Phosphatase 29 (*)    All other components within normal limits  CBC WITH DIFFERENTIAL/PLATELET - Abnormal; Notable for the following components:   Hemoglobin 11.5 (*)    All other components within normal limits  URINALYSIS, ROUTINE W REFLEX MICROSCOPIC - Abnormal; Notable for the following components:   Color, Urine STRAW (*)    Specific Gravity, Urine 1.004 (*)    Leukocytes, UA SMALL (*)    Bacteria, UA RARE (*)    All other components within normal limits  LIPASE, BLOOD  I-STAT BETA HCG BLOOD, ED ([facility], [facility], [facility] ONLY)  I-STAT TROPONIN, ED  I-STAT TROPONIN, ED    EKG EKG Interpretation  Date/Time:  Tuesday May 03 2018 19:25:15 EST Ventricular Rate:  75 PR Interval:    QRS Duration: 75 QT Interval:  399 QTC Calculation: 446 R Axis:   32 Text Interpretation:  Sinus rhythm EKG WITHIN NORMAL LIMITS Confirmed by Frederick Peers [phone]) on 05/03/2018 7:40:19 PM   Radiology Dg Chest 2 View  Result Date: 05/03/2018 CLINICAL DATA:  Chest and epigastric pain, shortness of breath, and nausea for 3 hours. EXAM: CHEST - 2 VIEW COMPARISON:  None. FINDINGS: The heart size and mediastinal contours are within normal limits. Both lungs are clear. The visualized skeletal structures are unremarkable. IMPRESSION: No active cardiopulmonary disease. Electronically Signed   By: Myles Rosenthal M.D.   On: 05/03/2018 18:02   US Abdomen Limited  Result Date: 05/03/2018 CLINICAL DATA:  Epigastric and right upper  quadrant tenderness. EXAM: ULTRASOUND ABDOMEN LIMITED RIGHT UPPER QUADRANT COMPARISON:  None. FINDINGS: Gallbladder: No gallstones or wall thickening visualized. No sonographic Murphy sign noted by sonographer. Common bile duct: Diameter: 6 mm, upper limits of normal Liver: Diffusely increased parenchymal echogenicity without focal lesion identified. Portal vein is patent on color Doppler imaging with normal direction of blood flow towards the liver. IMPRESSION: 1. Echogenic liver, nonspecific though may reflect steatosis. 2. Normal appearance of the gallbladder. Electronically Signed   By: Sebastian Ache M.D.   On: 05/03/2018 19:13   Ct Angio Chest/abd/pel For  Dissection W And/or W/wo  Result Date: 05/03/2018 CLINICAL DATA:  Acute onset epigastric pain radiating into the chest today. EXAM: CT ANGIOGRAPHY CHEST, ABDOMEN AND PELVIS TECHNIQUE: Multidetector CT imaging through the chest, abdomen and pelvis was performed using the standard protocol during bolus administration of intravenous contrast. Multiplanar reconstructed images and MIPs were obtained and reviewed to evaluate the vascular anatomy. CONTRAST:  100 mL ISOVUE-370 IOPAMIDOL (ISOVUE-370) INJECTION 76% COMPARISON:  PA and lateral chest earlier today. FINDINGS: CTA CHEST FINDINGS Cardiovascular: Preferential opacification of the thoracic aorta. No evidence of thoracic aortic aneurysm or dissection. Normal heart size. No pericardial effusion. Mediastinum/Nodes: No enlarged mediastinal, hilar, or axillary lymph nodes. Thyroid gland, trachea, and esophagus demonstrate no significant findings. Lungs/Pleura: Lungs are clear. No pleural effusion or pneumothorax. Musculoskeletal: Negative. Review of the MIP images confirms the above findings. CTA ABDOMEN AND PELVIS FINDINGS VASCULAR Aorta: Normal caliber aorta without aneurysm, dissection, vasculitis or significant stenosis. Celiac: Patent without evidence of aneurysm, dissection, vasculitis or significant  stenosis. SMA: Patent without evidence of aneurysm, dissection, vasculitis or significant stenosis. Renals: Both renal arteries are patent without evidence of aneurysm, dissection, vasculitis, fibromuscular dysplasia or significant stenosis. IMA: Patent without evidence of aneurysm, dissection, vasculitis or significant stenosis. Inflow: Patent without evidence of aneurysm, dissection, vasculitis or significant stenosis. Veins: No obvious venous abnormality within the limitations of this arterial phase study. Review of the MIP images confirms the above findings. NON-VASCULAR Hepatobiliary: The liver is low attenuating consistent with fatty infiltration. No focal lesion. Gallbladder and biliary tree appear normal. Pancreas: Unremarkable. No pancreatic ductal dilatation or surrounding inflammatory changes. Spleen: Normal in size without focal abnormality. Adrenals/Urinary Tract: Adrenal glands are unremarkable. Kidneys are normal, without renal calculi, focal lesion, or hydronephrosis. Bladder is unremarkable. Stomach/Bowel: Stomach is within normal limits. Appendix appears normal. No evidence of bowel wall thickening, distention, or inflammatory changes. Lymphatic: No lymphadenopathy. Reproductive: Uterus and bilateral adnexa are unremarkable. Other: None. Musculoskeletal: Negative. Review of the MIP images confirms the above findings. IMPRESSION: Negative for aortic dissection or aneurysm. No acute abnormality chest, pelvis. Fatty infiltration of the liver. Electronically Signed   By: Drusilla Kannerhomas  Dalessio M.D.   On: 05/03/2018 20:45    Procedures Procedures (including critical care time)  Medications Ordered in ED Medications  iopamidol (ISOVUE-370) 76 % injection (has no administration in time range)  ondansetron (ZOFRAN) injection 4 mg (4 mg Intravenous Given 05/03/18 1728)  fentaNYL (SUBLIMAZE) injection 50 mcg (50 mcg Intravenous Given 05/03/18 1730)  alum & mag hydroxide-simeth (MAALOX/MYLANTA) 200-200-20  MG/5ML suspension 30 mL (30 mLs Oral Given 05/03/18 1959)  dicyclomine (BENTYL) 10 MG/5ML syrup 10 mg (10 mg Oral Given 05/03/18 2054)  famotidine (PEPCID) tablet 20 mg (20 mg Oral Given 05/03/18 1959)  iopamidol (ISOVUE-370) 76 % injection 100 mL (100 mLs Intravenous Contrast Given 05/03/18 2008)     Initial Impression / Assessment and Plan / ED Course  I have reviewed the triage vital signs and the nursing notes.  Pertinent labs & imaging results that were available during my care of the patient were reviewed by me and considered in my medical decision making (see chart for details).     She was uncomfortable but nontoxic on exam, mildly hypertensive but remainder of vital signs reassuring.  EKG unremarkable.  She had right upper quadrant and midepigastric tenderness.  Because she endorsed some radiation into her chest, obtained troponin and chest x-ray, both of which were unremarkable.  Lab work shows reassuring CMP and lipase, normal CBC. DDX includes  gallbladder disease, lipase-negative pancreatitis, less likely aortic dissection given sudden onset. RUQ US shows no gallbladder pathology. Obtained CTA c/a/p to evaluate aorta and pancreas.  3-hour troponin normal.  Patient has no risk factors for heart disease and given atypical features, I feel ACS is unlikely.  CTA was unremarkable.  Patient feels improved after above medications including GI cocktail.  Because her pain is epigastric, I recommended a low acid diet and a trial of H2 blocker with f/u with PCP for reassessment.  I have extensively reviewed her precautions and she voiced understanding.  Final Clinical Impressions(s) / ED Diagnoses   Final diagnoses:  Epigastric pain  Atypical chest pain    ED Discharge Orders    None       Yuvonne Lanahan, Ambrose Finland, MD 05/03/18 2220

## 2018-05-03 NOTE — Discharge Instructions (Addendum)
START TAKING PEPCID OR ZANTAC DAILY. AVOID SPICY FOODS, TOMATOES, AND CITRUS JUICE. FOLLOW UP WITH PRIMARY CARE DOCTOR FOR RECHECK. RETURN TO ER IF PAIN WORSENS OR IF YOU HAVE BREATHING PROBLEMS, SEVERE VOMITING, OR SEVERE CHEST PAIN.

## 2019-06-24 IMAGING — CT CT ANGIO CHEST-ABD-PELV FOR DISSECTION W/ AND WO/W CM
2 of 7 series · 14 of 46 positions shown, 16 images · IV contrast (iopamidol)
Comparison: PA and lateral chest earlier today.

CLINICAL DATA: Acute onset epigastric pain radiating into the chest
today.

EXAM:
CT ANGIOGRAPHY CHEST, ABDOMEN AND PELVIS
TECHNIQUE: Multidetector CT imaging through the chest, abdomen and pelvis was
performed using the standard protocol during bolus administration of
intravenous contrast. Multiplanar reconstructed images and MIPs were
obtained and reviewed to evaluate the vascular anatomy.
CONTRAST:  100 mL [id number] IOPAMIDOL (ZC587D-A4R) INJECTION 76%

[Series 6: arterial · axial · arterial · 0.70mm/px · z∈[-647,-111]mm · 11 of 300 slices shown, 13 images]
[im 16/300  soft-tissue]
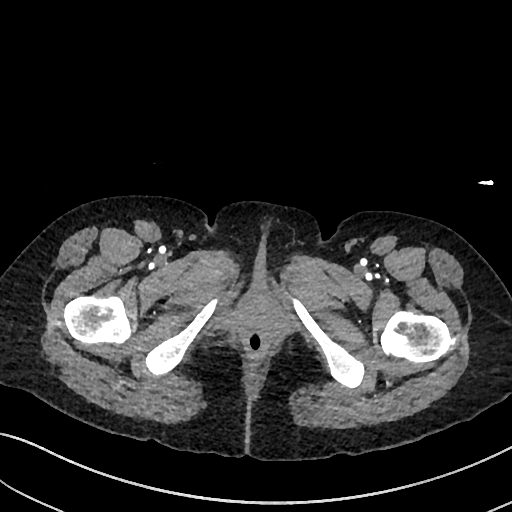
[im 16/300  bone]
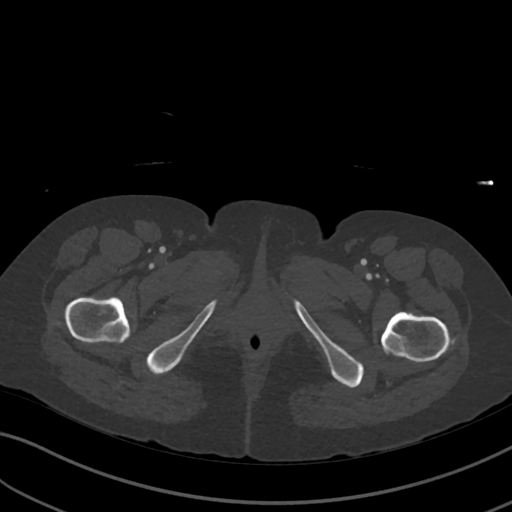
[im 48/300  soft-tissue]
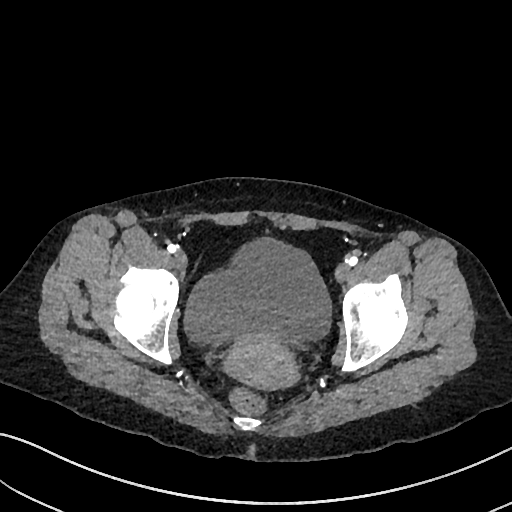
[im 79/300  soft-tissue]
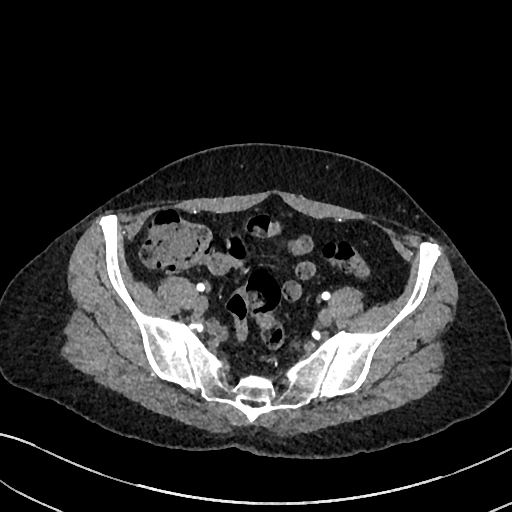
[im 95/300  soft-tissue]
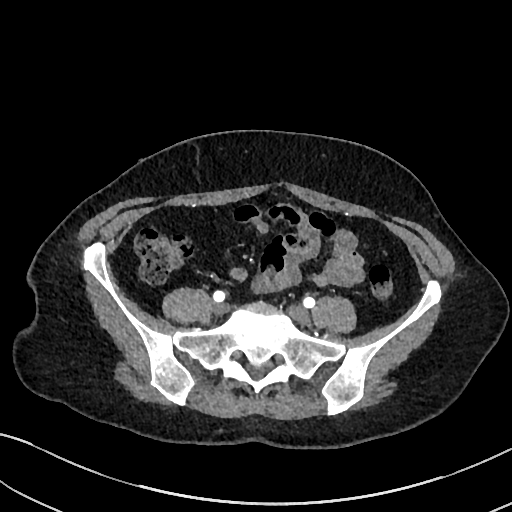
[im 126/300  soft-tissue]
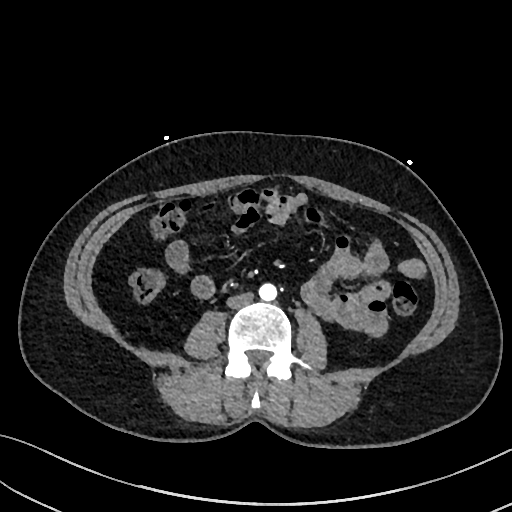
[im 158/300  soft-tissue]
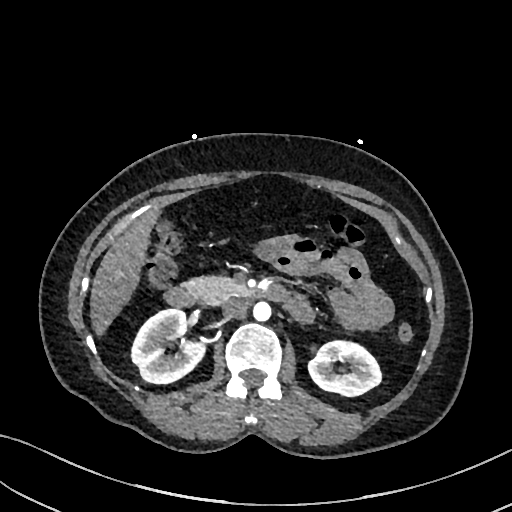
[im 174/300  soft-tissue]
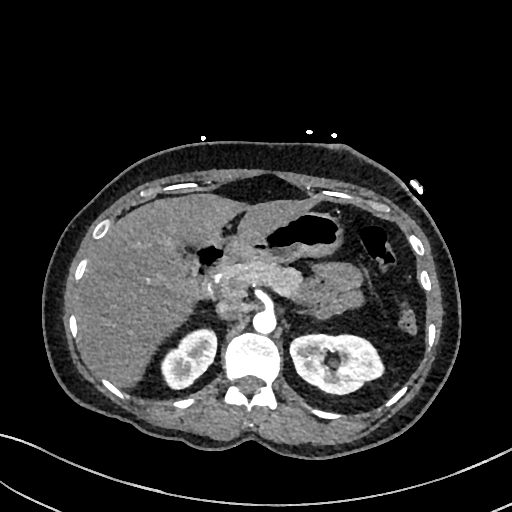
[im 205/300  soft-tissue]
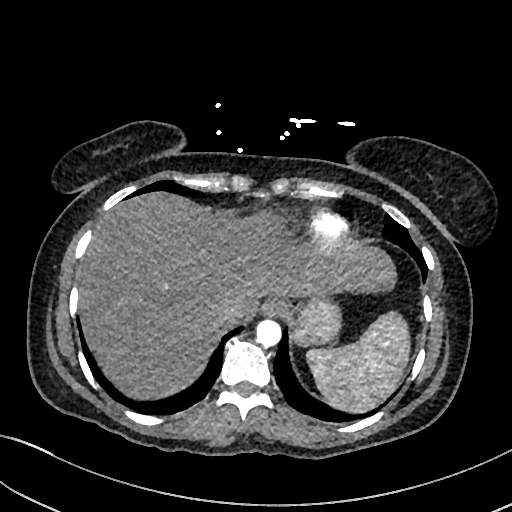
[im 221/300  soft-tissue]
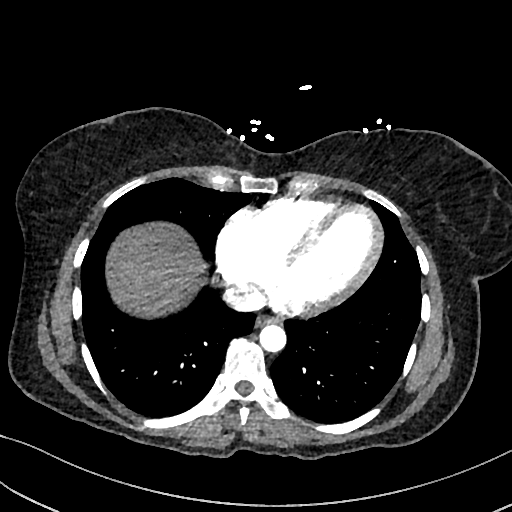
[im 221/300  bone]
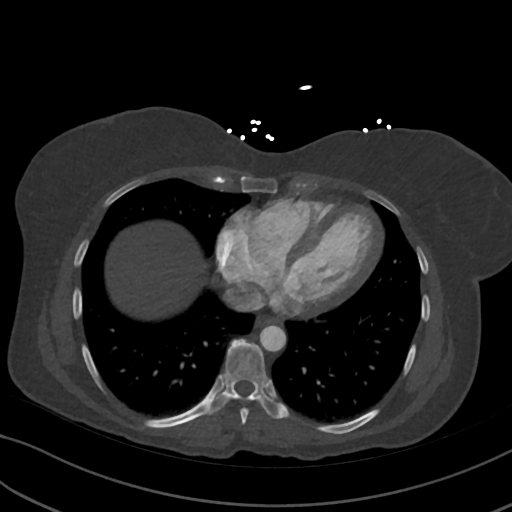
[im 252/300  soft-tissue]
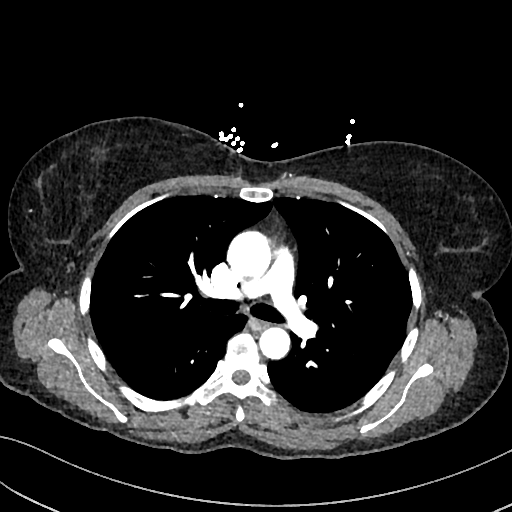
[im 284/300  soft-tissue]
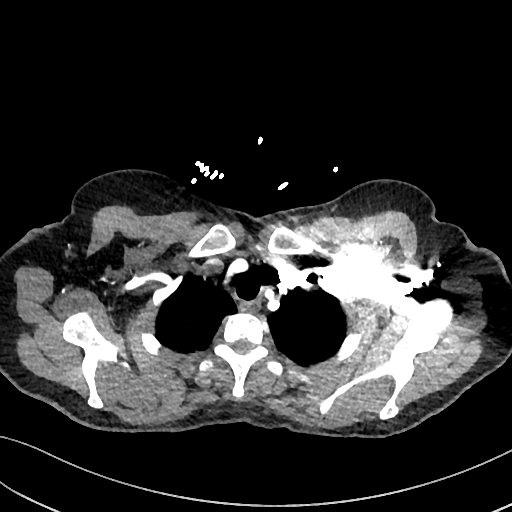

[Series 9: cor · coronal · 0.70mm/px · 3 of 126 slices shown]
[im 32/126  soft-tissue]
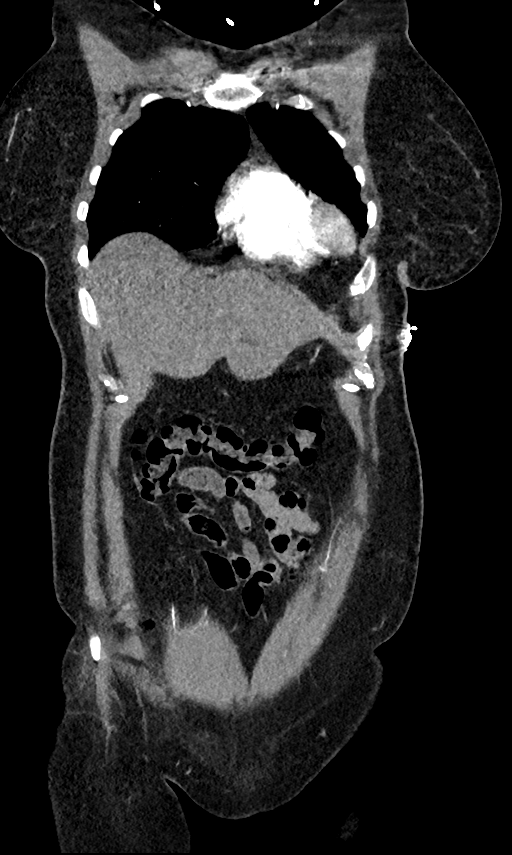
[im 63/126  soft-tissue]
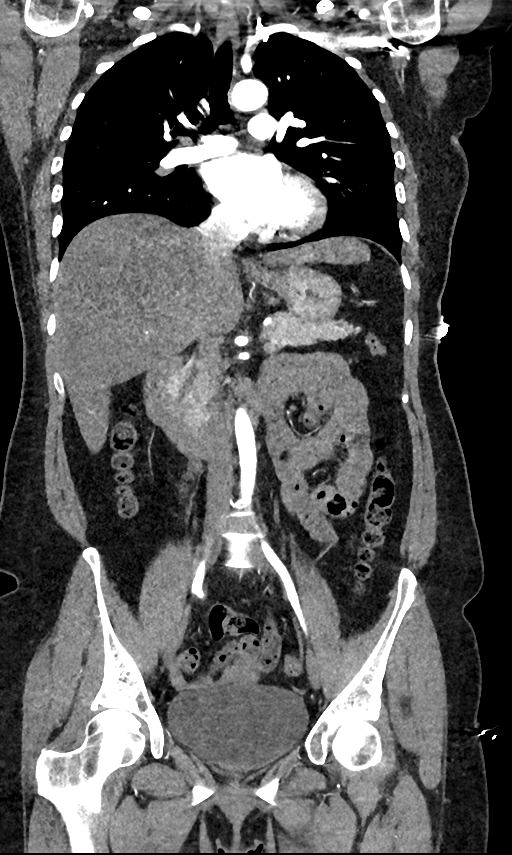
[im 94/126  soft-tissue]
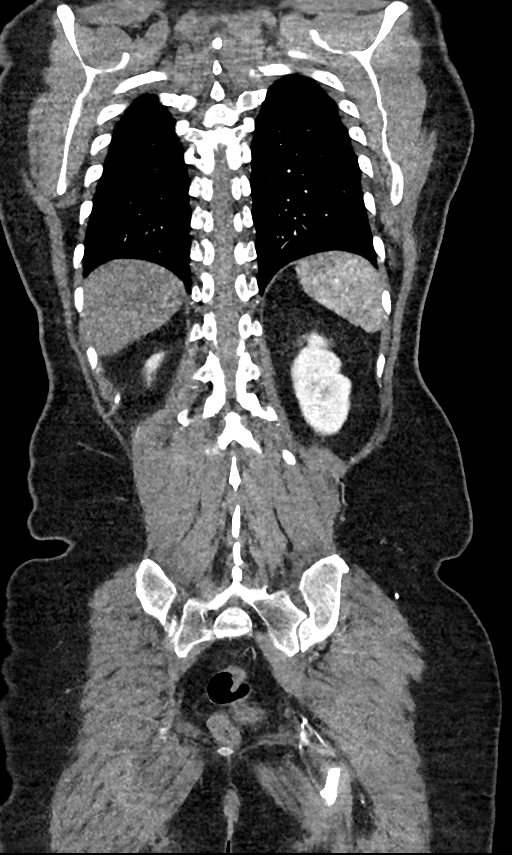

[14 of 46 positions shown; findings below may reference images not displayed]

FINDINGS: CTA CHEST FINDINGS

Cardiovascular: Preferential opacification of the thoracic aorta. No
evidence of thoracic aortic aneurysm or dissection. Normal heart
size. No pericardial effusion.

Mediastinum/Nodes: No enlarged mediastinal, hilar, or axillary lymph
nodes. Thyroid gland, trachea, and esophagus demonstrate no
significant findings.

Lungs/Pleura: Lungs are clear. No pleural effusion or pneumothorax.

Musculoskeletal: Negative.

Review of the MIP images confirms the above findings.

CTA ABDOMEN AND PELVIS FINDINGS

VASCULAR

Aorta: Normal caliber aorta without aneurysm, dissection, vasculitis
or significant stenosis.

Celiac: Patent without evidence of aneurysm, dissection, vasculitis
or significant stenosis.

SMA: Patent without evidence of aneurysm, dissection, vasculitis or
significant stenosis.

Renals: Both renal arteries are patent without evidence of aneurysm,
dissection, vasculitis, fibromuscular dysplasia or significant
stenosis.

IMA: Patent without evidence of aneurysm, dissection, vasculitis or
significant stenosis.

Inflow: Patent without evidence of aneurysm, dissection, vasculitis
or significant stenosis.

Veins: No obvious venous abnormality within the limitations of this
arterial phase study.

Review of the MIP images confirms the above findings.

NON-VASCULAR

Hepatobiliary: The liver is low attenuating consistent with fatty
infiltration. No focal lesion. Gallbladder and biliary tree appear
normal.

Pancreas: Unremarkable. No pancreatic ductal dilatation or
surrounding inflammatory changes.

Spleen: Normal in size without focal abnormality.

Adrenals/Urinary Tract: Adrenal glands are unremarkable. Kidneys are
normal, without renal calculi, focal lesion, or hydronephrosis.
Bladder is unremarkable.

Stomach/Bowel: Stomach is within normal limits. Appendix appears
normal. No evidence of bowel wall thickening, distention, or
inflammatory changes.

Lymphatic: No lymphadenopathy.

Reproductive: Uterus and bilateral adnexa are unremarkable.

Other: None.

Musculoskeletal: Negative.

Review of the MIP images confirms the above findings.
IMPRESSION: Negative for aortic dissection or aneurysm. No acute abnormality
chest, pelvis.

Fatty infiltration of the liver.

## 2020-05-10 ENCOUNTER — Encounter (HOSPITAL_COMMUNITY): Payer: Self-pay | Admitting: *Deleted
